# Patient Record
Sex: Male | Born: 1960
Health system: Southern US, Community
[De-identification: ages and names within clinical notes are randomized; demographics above are authoritative.]

## PROBLEM LIST (undated history)

## (undated) DIAGNOSIS — C439 Malignant melanoma of skin, unspecified: Secondary | ICD-10-CM

## (undated) DIAGNOSIS — E785 Hyperlipidemia, unspecified: Secondary | ICD-10-CM

## (undated) DIAGNOSIS — B009 Herpesviral infection, unspecified: Secondary | ICD-10-CM

## (undated) HISTORY — DX: Herpesviral infection, unspecified: B00.9

## (undated) HISTORY — PX: FOOT SURGERY: SHX648

## (undated) HISTORY — DX: Malignant melanoma of skin, unspecified: C43.9

## (undated) HISTORY — PX: SKIN SURGERY: SHX2413

## (undated) HISTORY — DX: Hyperlipidemia, unspecified: E78.5

---

## 2012-06-10 LAB — HM COLONOSCOPY

## 2013-12-03 LAB — PSA: PSA: 0.9

## 2013-12-03 LAB — LIPID PANEL
Cholesterol: 195 mg/dL (ref 0–200)
HDL: 38 mg/dL (ref 35–70)
LDL CALC: 132 mg/dL
Triglycerides: 125 mg/dL (ref 40–160)

## 2013-12-03 LAB — TSH: TSH: 2.1 u[IU]/mL (ref 0.41–5.90)

## 2015-01-24 LAB — HEPATIC FUNCTION PANEL
ALT: 26 U/L (ref 10–40)
AST: 21 U/L (ref 14–40)
BILIRUBIN, TOTAL: 0.5 mg/dL

## 2015-01-24 LAB — BASIC METABOLIC PANEL: Creatinine: 1 mg/dL (ref <=1.3)

## 2015-07-11 ENCOUNTER — Other Ambulatory Visit: Payer: Self-pay | Admitting: Orthopedic Surgery

## 2015-07-11 DIAGNOSIS — M79671 Pain in right foot: Secondary | ICD-10-CM

## 2015-08-03 ENCOUNTER — Ambulatory Visit
Admission: RE | Admit: 2015-08-03 | Discharge: 2015-08-03 | Disposition: A | Discharge status: Home / Self Care | Payer: BLUE CROSS/BLUE SHIELD | Source: Ambulatory Visit | Attending: Orthopedic Surgery | Admitting: Orthopedic Surgery

## 2015-08-03 DIAGNOSIS — M79671 Pain in right foot: Secondary | ICD-10-CM

## 2016-05-10 DIAGNOSIS — B009 Herpesviral infection, unspecified: Secondary | ICD-10-CM | POA: Diagnosis not present

## 2016-05-10 DIAGNOSIS — E7800 Pure hypercholesterolemia, unspecified: Secondary | ICD-10-CM | POA: Diagnosis not present

## 2016-05-23 DIAGNOSIS — Z0000 Encounter for general adult medical examination without abnormal findings: Secondary | ICD-10-CM | POA: Diagnosis not present

## 2016-05-24 LAB — TSH: TSH: 1.48 u[IU]/mL (ref 0.41–5.90)

## 2016-05-24 LAB — BASIC METABOLIC PANEL
BUN: 14 mg/dL (ref 4–21)
Creatinine: 1 mg/dL (ref 0.6–1.3)
GLUCOSE: 98 mg/dL
POTASSIUM: 4.6 mmol/L (ref 3.4–5.3)
SODIUM: 143 mmol/L (ref 137–147)

## 2016-05-24 LAB — LIPID PANEL
CHOLESTEROL: 152 mg/dL (ref 0–200)
HDL: 46 mg/dL (ref 35–70)
LDL Cholesterol: 90 mg/dL
LDl/HDL Ratio: 2
Triglycerides: 82 mg/dL (ref 40–160)

## 2016-05-24 LAB — HEPATIC FUNCTION PANEL
ALT: 25 U/L (ref 10–40)
AST: 22 U/L (ref 14–40)
Alkaline Phosphatase: 58 U/L (ref 25–125)
Bilirubin, Total: 0.5 mg/dL

## 2016-05-24 LAB — CBC AND DIFFERENTIAL
HEMATOCRIT: 47 % (ref 41–53)
Hemoglobin: 15.9 g/dL (ref 13.5–17.5)
Neutrophils Absolute: 3 /uL
Platelets: 202 10*3/uL (ref 150–399)
WBC: 5.4 10^3/mL

## 2016-05-27 ENCOUNTER — Ambulatory Visit (INDEPENDENT_AMBULATORY_CARE_PROVIDER_SITE_OTHER): Payer: BLUE CROSS/BLUE SHIELD | Admitting: Family Medicine

## 2016-05-27 ENCOUNTER — Encounter: Payer: Self-pay | Admitting: Family Medicine

## 2016-05-27 DIAGNOSIS — E785 Hyperlipidemia, unspecified: Secondary | ICD-10-CM | POA: Diagnosis not present

## 2016-05-27 DIAGNOSIS — L570 Actinic keratosis: Secondary | ICD-10-CM | POA: Insufficient documentation

## 2016-05-27 DIAGNOSIS — B009 Herpesviral infection, unspecified: Secondary | ICD-10-CM | POA: Diagnosis not present

## 2016-05-27 MED ORDER — ACYCLOVIR 400 MG PO TABS: 400.0000 mg | ORAL_TABLET | Freq: Every day | ORAL | 3 refills | Status: DC

## 2016-05-27 MED ORDER — ATORVASTATIN CALCIUM 40 MG PO TABS: 40.0000 mg | ORAL_TABLET | Freq: Every day | ORAL | 3 refills | Status: DC

## 2016-05-27 NOTE — Assessment & Plan Note (Signed)
S:acyclovir 400mg  BID for suppression in past--> now daily. no flare in years.  A/P: doing well- refilled medication.

## 2016-05-27 NOTE — Progress Notes (Signed)
Phone: [phone]  Subjective:  Patient presents today to establish care.  Prior patient of Dr Tollie Pizza of [facility] [place]. . Chief complaint-noted.   See problem oriented charting  The following were reviewed and entered/updated in epic: Past Medical History:  Diagnosis Date  . HSV infection    acyclovir 400mg  BID for suppression. no flare in years.   . Hyperlipidemia    atorvastatin 40mg .    Patient Active Problem List   Diagnosis Date Noted  . Hyperlipidemia     Priority: Medium  . HSV infection     Priority: Medium  . Actinic keratosis 05/27/2016    Priority: Low   Past Surgical History:  Procedure Laterality Date  . FOOT SURGERY Right    nov 16. Dr. Doran Durand initial. thinking about seeking [facility] opinion    Family History  Problem Relation Age of Onset  . Hyperlipidemia Mother     8 in 2018  . Heart disease Mother     pacemaker  . Healthy Father     52 in 2018  . Healthy Sister   . Melanoma Brother   . Healthy Daughter   . Healthy Son     Medications- reviewed and updated Current Outpatient Prescriptions  Medication Sig Dispense Refill  . atorvastatin (LIPITOR) 40 MG tablet Take 1 tablet (40 mg total) by mouth daily. 91 tablet 3  . acyclovir (ZOVIRAX) 400 MG tablet Take 1 tablet (400 mg total) by mouth daily. 91 tablet 3   No current facility-administered medications for this visit.     Allergies-reviewed and updated No Known Allergies  Social History   Social History  . Marital status: Married    Spouse name: N/A  . Number of children: N/A  . Years of education: N/A   Social History Main Topics  . Smoking status: Never Smoker  . Smokeless tobacco: Never Used  . Alcohol use 2.4 - 3.0 oz/week    4 - 5 Standard drinks or equivalent per week     Comment: Social  . Drug use: No  . Sexual activity: Yes   Other Topics Concern  . None   Social History Narrative   Married. See wife Lelon Frohlich and will see son Yong Channel 20- app state. Daughter  18- senior at [employer]. [employer], [place], [employer], [employer].       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    [employer] prior.       Hobbies: beach, fishing    ROS--Full ROS was completed Review of Systems  Constitutional: Negative for chills and fever.  HENT: Negative for hearing loss.   Eyes: Negative for blurred vision and double vision.  Respiratory: Negative for cough and shortness of breath.   Cardiovascular: Negative for chest pain and palpitations.  Gastrointestinal: Negative for heartburn and nausea.  Genitourinary: Negative for dysuria and urgency.  Musculoskeletal: Negative for myalgias and neck pain.  Skin: Negative for itching and rash.  Neurological: Negative for dizziness and headaches.  Endo/Heme/Allergies: Negative for polydipsia. Does not bruise/bleed easily.  Psychiatric/Behavioral: Negative for hallucinations and substance abuse.   Objective: BP 116/78 (BP Location: Left Arm, Patient Position: Sitting, Cuff Size: Large)   Pulse 76   Temp 99 F (37.2 C) (Oral)   Ht 6' 1.5" (1.867 m)   Wt 242 lb 3.2 oz (109.9 kg)   SpO2 94%   BMI 31.52 kg/m  Gen: NAD, resting comfortably HEENT: Mucous membranes are moist. Oropharynx normal. TM normal. Eyes: sclera and lids normal, PERRLA Neck: no thyromegaly,  no cervical lymphadenopathy CV: RRR no murmurs rubs or gallops Lungs: CTAB no crackles, wheeze, rhonchi Abdomen: soft/nontender/nondistended/normal bowel sounds. No rebound or guarding. overweight Ext: no edema Skin: warm, dry Neuro: 5/5 strength in upper and lower extremities, normal gait, normal reflexes  Assessment/Plan:  Health Maintenance Due  Topic Date Due  . Hepatitis C Screening - discuss at [facility], declines for now 55-30-1962  . HIV Screening  - discuss at [facility], declines for now 08/31/53    . COLONOSCOPY - Dr. Collene Mares at age 65. Told 10 year follow up. Told to sign ROI. Will check on next visit.  09/01/2011   Discussed healthy eating, regular exercise, target more  215-225 range.   Hyperlipidemia S:atorvastatin 40mg  for 5 years. Lost 65 lbs in 2017. 275 max to 212 then gained over 20 lbs. Wants to target 225. College 186.  A/P:bloodwork at work within a week- will send me a copy. cbg had bene noted at 104 in past- check on this with labs as well.   HSV infection S:acyclovir 400mg  BID for suppression in past--> now daily. no flare in years.  A/P: doing well- refilled medication.     Return in about 1 year (around 05/27/2017) for physical unless you need [place] sooner or bloodwork concerns.  Meds ordered this encounter  Medications  . DISCONTD: atorvastatin (LIPITOR) 40 MG tablet    Sig: Take 40 mg by mouth daily.  Marland Kitchen acyclovir (ZOVIRAX) 400 MG tablet    Sig: Take 1 tablet (400 mg total) by mouth daily.    Dispense:  91 tablet    Refill:  3  . atorvastatin (LIPITOR) 40 MG tablet    Sig: Take 1 tablet (40 mg total) by mouth daily.    Dispense:  91 tablet    Refill:  3    Return precautions advised.  Garret Reddish, MD

## 2016-05-27 NOTE — Assessment & Plan Note (Addendum)
S:atorvastatin 40mg  for 5 years. Lost 65 lbs in 2017. 275 max to 212 then gained over 20 lbs. Wants to target 225. College 186.  A/P:bloodwork at work within a week- will send me a copy. cbg had bene noted at 104 in past- check on this with labs as well.

## 2016-05-27 NOTE — Patient Instructions (Addendum)
Sign release of information at the check out desk for Dr. Collene Mares last colonoscopy  ______________________________________________________________________  Starting October 1st 2018, I will be transferring to our new location: [facility] [address] (corner of [address] [employer]) [place], [place] Phone: [phone]  I would love to have you remain my patient at this new location as long as it remains convenient for you. I am excited about the opportunity to have x-ray and sports medicine in the new building but will really miss the awesome staff and physicians at [facility]. Continue to schedule appointments at [facility] and we will automatically transfer them to the [place] location starting October 1st.

## 2016-05-27 NOTE — Progress Notes (Signed)
Pre visit review using our clinic review tool, if applicable. No additional management support is needed unless otherwise documented below in the visit note. 

## 2016-06-05 ENCOUNTER — Encounter: Payer: Self-pay | Admitting: Family Medicine

## 2016-06-07 ENCOUNTER — Encounter: Payer: Self-pay | Admitting: Family Medicine

## 2016-06-07 DIAGNOSIS — R739 Hyperglycemia, unspecified: Secondary | ICD-10-CM | POA: Insufficient documentation

## 2016-08-28 DIAGNOSIS — D2271 Melanocytic nevi of right lower limb, including hip: Secondary | ICD-10-CM | POA: Diagnosis not present

## 2016-08-28 DIAGNOSIS — D225 Melanocytic nevi of trunk: Secondary | ICD-10-CM | POA: Diagnosis not present

## 2016-08-28 DIAGNOSIS — L858 Other specified epidermal thickening: Secondary | ICD-10-CM | POA: Diagnosis not present

## 2016-08-28 DIAGNOSIS — D2261 Melanocytic nevi of right upper limb, including shoulder: Secondary | ICD-10-CM | POA: Diagnosis not present

## 2016-11-21 ENCOUNTER — Encounter: Payer: Self-pay | Admitting: Family Medicine

## 2016-11-21 ENCOUNTER — Ambulatory Visit (INDEPENDENT_AMBULATORY_CARE_PROVIDER_SITE_OTHER): Payer: BLUE CROSS/BLUE SHIELD | Admitting: Family Medicine

## 2016-11-21 VITALS — BP 120/86 | HR 84 | Temp 98.3°F | Ht 73.75 in | Wt 242.8 lb

## 2016-11-21 DIAGNOSIS — Z0001 Encounter for general adult medical examination with abnormal findings: Secondary | ICD-10-CM | POA: Diagnosis not present

## 2016-11-21 DIAGNOSIS — Z1159 Encounter for screening for other viral diseases: Secondary | ICD-10-CM

## 2016-11-21 DIAGNOSIS — Z125 Encounter for screening for malignant neoplasm of prostate: Secondary | ICD-10-CM

## 2016-11-21 DIAGNOSIS — Z7251 High risk heterosexual behavior: Secondary | ICD-10-CM

## 2016-11-21 DIAGNOSIS — Z114 Encounter for screening for human immunodeficiency virus [HIV]: Secondary | ICD-10-CM

## 2016-11-21 DIAGNOSIS — E785 Hyperlipidemia, unspecified: Secondary | ICD-10-CM

## 2016-11-21 LAB — COMPREHENSIVE METABOLIC PANEL
ALBUMIN: 4.6 g/dL (ref 3.5–5.2)
ALK PHOS: 58 U/L (ref 39–117)
ALT: 26 U/L (ref 0–53)
AST: 23 U/L (ref 0–37)
BILIRUBIN TOTAL: 0.9 mg/dL (ref 0.2–1.2)
BUN: 13 mg/dL (ref 6–23)
CALCIUM: 9.7 mg/dL (ref 8.4–10.5)
CHLORIDE: 101 meq/L (ref 96–112)
CO2: 31 mEq/L (ref 19–32)
CREATININE: 0.93 mg/dL (ref 0.40–1.50)
GFR: 89.58 mL/min (ref >60.00)
Glucose, Bld: 99 mg/dL (ref 70–99)
Potassium: 4.5 mEq/L (ref 3.5–5.1)
SODIUM: 140 meq/L (ref 135–145)
TOTAL PROTEIN: 7.4 g/dL (ref 6.0–8.3)

## 2016-11-21 LAB — PSA: PSA: 0.58 ng/mL (ref 0.10–4.00)

## 2016-11-21 LAB — LIPID PANEL
CHOLESTEROL: 164 mg/dL (ref 0–200)
HDL: 64.1 mg/dL (ref >39.00)
LDL CALC: 81 mg/dL (ref 0–99)
NonHDL: 100.02
TRIGLYCERIDES: 93 mg/dL (ref 0.0–149.0)
Total CHOL/HDL Ratio: 3
VLDL: 18.6 mg/dL (ref 0.0–40.0)

## 2016-11-21 LAB — CBC
HCT: 48.7 % (ref 39.0–52.0)
Hemoglobin: 16.1 g/dL (ref 13.0–17.0)
MCHC: 32.9 g/dL (ref 30.0–36.0)
MCV: 88.8 fl (ref 78.0–100.0)
PLATELETS: 218 10*3/uL (ref 150.0–400.0)
RBC: 5.49 Mil/uL (ref 4.22–5.81)
RDW: 13.7 % (ref 11.5–15.5)
WBC: 5.7 10*3/uL (ref 4.0–10.5)

## 2016-11-21 MED ORDER — AMOXICILLIN-POT CLAVULANATE 875-125 MG PO TABS: 1.0000 | ORAL_TABLET | Freq: Two times a day (BID) | ORAL | 0 refills | Status: AC

## 2016-11-21 NOTE — Progress Notes (Signed)
Phone: [phone]  Subjective:  Patient presents today for their annual physical. Chief complaint-noted.   See problem oriented charting- ROS- full  review of systems was completed and negative except for: sinus symptoms as noted below  The following were reviewed and entered/updated in epic: Past Medical History:  Diagnosis Date  . HSV infection    acyclovir 400mg  BID for suppression. no flare in years.   . Hyperlipidemia    atorvastatin 40mg .    Patient Active Problem List   Diagnosis Date Noted  . Hyperlipidemia     Priority: Medium  . HSV infection     Priority: Medium  . Hyperglycemia 06/07/2016    Priority: Low  . Actinic keratosis 05/27/2016    Priority: Low   Past Surgical History:  Procedure Laterality Date  . FOOT SURGERY Right    nov 16. Dr. Doran Durand initial. thinking about seeking [facility] opinion    Family History  Problem Relation Age of Onset  . Hyperlipidemia Mother        85 in 2018  . Heart disease Mother        pacemaker  . Healthy Father        23 in 2018  . Healthy Sister   . Melanoma Brother   . Healthy Daughter   . Healthy Son     Medications- reviewed and updated Current Outpatient Prescriptions  Medication Sig Dispense Refill  . acyclovir (ZOVIRAX) 400 MG tablet Take 1 tablet (400 mg total) by mouth daily. 91 tablet 3  . atorvastatin (LIPITOR) 40 MG tablet Take 1 tablet (40 mg total) by mouth daily. 91 tablet 3  . amoxicillin-clavulanate (AUGMENTIN) 875-125 MG tablet Take 1 tablet by mouth 2 (two) times daily. 14 tablet 0   No current facility-administered medications for this visit.     Allergies-reviewed and updated No Known Allergies  Social History   Social History  . Marital status: Married    Spouse name: N/A  . Number of children: N/A  . Years of education: N/A   Social History Main Topics  . Smoking status: Never Smoker  . Smokeless tobacco: Never Used  . Alcohol use 2.4 - 3.0 oz/week    4 - 5 Standard drinks or  equivalent per week     Comment: Social  . Drug use: No  . Sexual activity: Yes   Other Topics Concern  . None   Social History Narrative   Married. See wife Lelon Frohlich and will see son Yong Channel 20- app state. Daughter 36- senior at [employer]. [employer], [place], [employer], [employer].       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    [employer] prior.       Hobbies: beach, fishing    Objective: BP 120/86 (BP Location: Left Arm, Patient Position: Sitting, Cuff Size: Large)   Pulse 84   Temp 98.3 F (36.8 C) (Oral)   Ht 6' 1.75" (1.873 m)   Wt 242 lb 12.8 oz (110.1 kg)   SpO2 95%   BMI 31.39 kg/m  Gen: NAD, resting comfortably HEENT: Mucous membranes are moist. Pharynx mild erythema. Nares erythematous with green discharge. Maxillary sinus tenderness.  Neck: no thyromegaly CV: RRR no murmurs rubs or gallops Lungs: CTAB no crackles, wheeze, rhonchi Abdomen: soft/nontender/nondistended/normal bowel sounds. No rebound or guarding.  Ext: no edema Skin: warm, dry Neuro: grossly normal, moves all extremities, PERRLA Rectal: normal tone, normal sized prostate, no masses or tenderness  Assessment/Plan:  56 y.o. male presenting for annual physical.  Health  Maintenance counseling: 1. Anticipatory guidance: Patient counseled regarding regular dental exams -q6 months, eye exams - yearly or every 2 years, wearing seatbelts.  2. Risk factor reduction:  Advised patient of need for regular exercise and diet rich and fruits and vegetables to reduce risk of heart attack and stroke. Exercise- 1-2 days a week. discussed 150 minutes a week. Diet-feels he needs to cut carbs down and lean out his diet. Knows he needs to lose weight. Set goal within a year of at least 10 lbs.  Wt Readings from Last 3 Encounters:  11/21/16 242 lb 12.8 oz (110.1 kg)  05/27/16 242 lb 3.2 oz (109.9 kg)  3. Immunizations/screenings/ancillary studies- offered flu shot - declines (typically gets sick). Discussed shingrix availability issues.  Screen HIV and HCV with labs Immunization History  Administered Date(s) Administered  . Tdap 08/09/2008   4. Prostate cancer screening-  will update PSA today to trend. Low risk rectal .  Lab Results  Component Value Date   PSA 0.90 12/03/2013   5. Colon cancer screening -  do not have records from Dr. Collene Mares at age 56- should be good until 9 6. Skin cancer screening- sees Dr. Martinique most years with [place] [facility]. advised regular sunscreen use. Denies worrisome, changing, or new skin lesions.   Status of chronic or acute concerns   Took zyrtec D this morning. Sore throat, nasal drainage- green. Minimal sinus pressure. Coughing up green. No ear pain. No SOb. 10 days of symptoms Treat for bacterial sinusitis given sinus pressure on exam, 10 days of symptoms with purulent discharge. augmentin 10 days  Breaking out daily in hives on face- taking benadryl or claritin when it comes on. Started on neck now onto face. No new shaving cream or shampoo, soap, dry cleaner. No lip or tongue swelling. Going on for 1.5 years. Short lived.  Trial more regular antihistamine use. Could add h2 blocker. Benadryl for back up. Declines allergist referral.   HLD- compliant atorvastatin 40mg . In march full lipids show LDL 90.   HSV infection- on daily suppresive therapy with acyclovir 400mg  daily only  Hyperglycemia- 2018 down to 98 with weigh tloss  1 year CPE  Orders Placed This Encounter  Procedures  . CBC    Standing Status:   Future    Standing Expiration Date:   11/21/2017  . Comprehensive metabolic panel    [place]    Standing Status:   Future    Standing Expiration Date:   11/21/2017  . HIV antibody    Standing Status:   Future    Standing Expiration Date:   11/21/2017  . Hepatitis C antibody    Standing Status:   Future    Standing Expiration Date:   11/21/2017  . Lipid panel    Standing Status:   Future    Standing Expiration Date:   11/21/2017  . PSA    Standing Status:   Future    Standing  Expiration Date:   11/21/2017    Meds ordered this encounter  Medications  . amoxicillin-clavulanate (AUGMENTIN) 875-125 MG tablet    Sig: Take 1 tablet by mouth 2 (two) times daily.    Dispense:  14 tablet    Refill:  0    Return precautions advised.  Garret Reddish, MD

## 2016-11-21 NOTE — Patient Instructions (Addendum)
Sign release of information at the check out desk for colonoscopy from Dr. Collene Mares  Bacterial sinus infection based on 10 days of symptoms, sinus pressure, green/yellow discharge. Treat with augmentin for 7 days twice a day- take with food  Let me know if you change your mind on the allergist appointment. Would take claritin or zyrtec daily to at least minimize the hives.   Sugar was ok at your office check- hopeful remains ok- regardless lets set a goal of 10 lbs off at minimum in next year.   Please stop by lab before you go

## 2016-11-21 NOTE — Addendum Note (Signed)
Addended by: Tomi Likens on: 11/21/2016 10:52 AM   Modules accepted: Orders

## 2016-11-22 LAB — HEPATITIS C ANTIBODY
HEP C AB: NONREACTIVE
SIGNAL TO CUT-OFF: 0.01 (ref <1.00)

## 2016-11-22 LAB — HIV ANTIBODY (ROUTINE TESTING W REFLEX): HIV: NONREACTIVE

## 2017-05-16 ENCOUNTER — Other Ambulatory Visit: Payer: Self-pay | Admitting: Family Medicine

## 2017-06-18 IMAGING — CT CT FOOT*R* W/O CM
3 of 4 series · 12 of 33 positions shown, 14 images · non-contrast
Comparison: None.

CLINICAL DATA: Right foot pain, injured several years ago. Right
foot surgery January 2015.

EXAM:
CT OF THE RIGHT FOOT WITHOUT CONTRAST
TECHNIQUE: Multidetector CT imaging of the right foot was performed according
to the standard protocol. Multiplanar CT image reconstructions were
also generated.

[Series 10: sagsoft tissue · sagittal · 0.32mm/px · 5 of 53 slices shown, 6 images]
[im 18/53  bone]
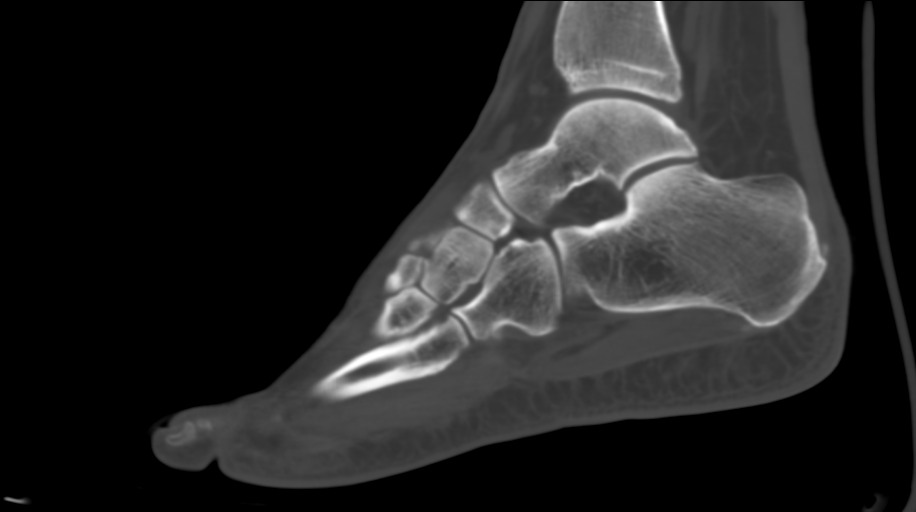
[im 22/53  bone]
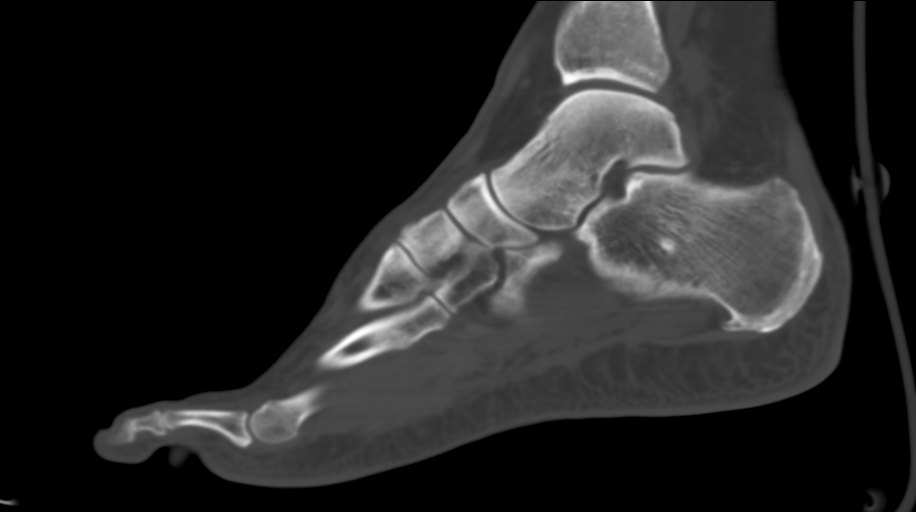
[im 27/53  soft-tissue]
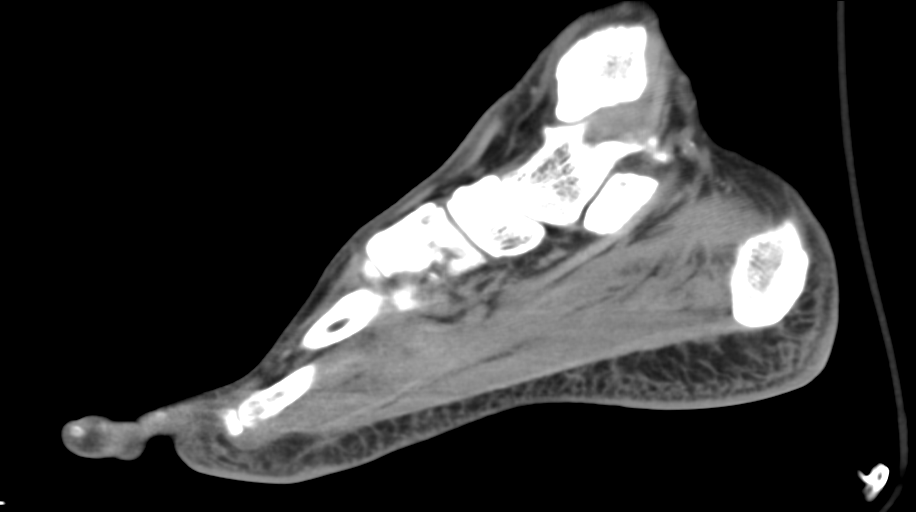
[im 27/53  bone]
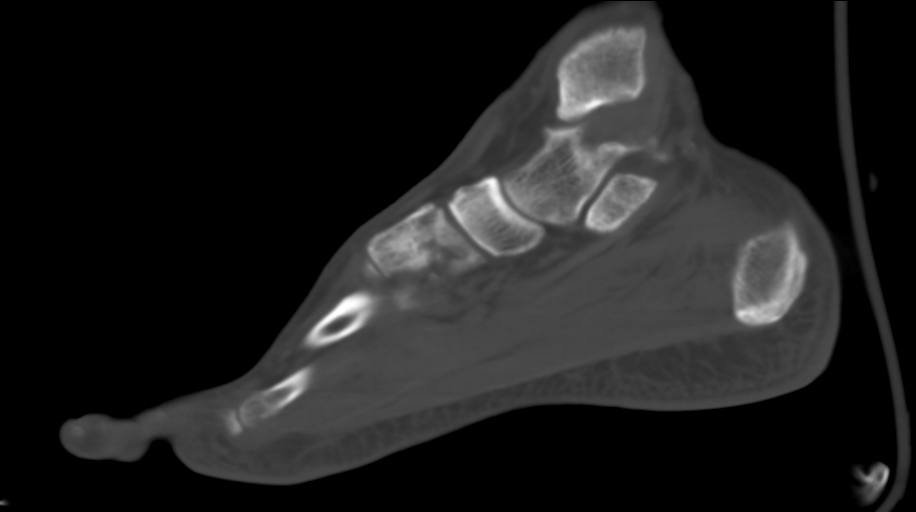
[im 31/53  bone]
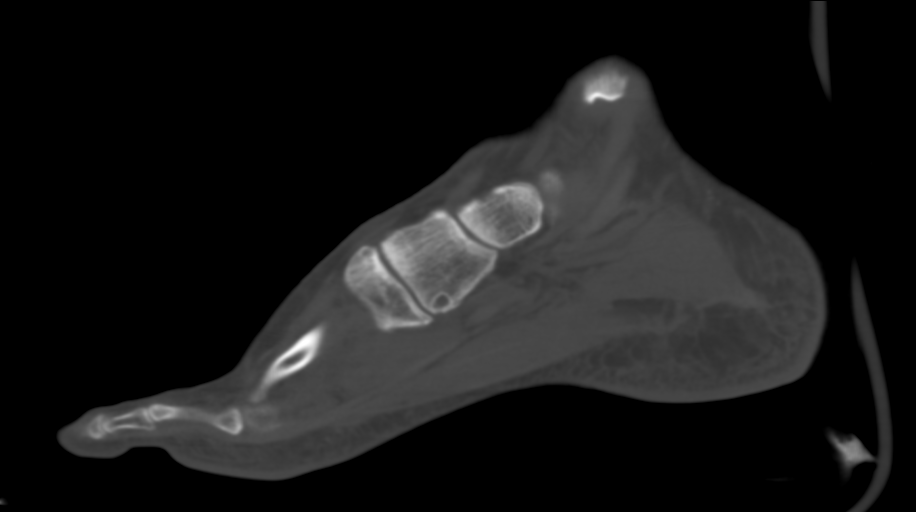
[im 35/53  bone]
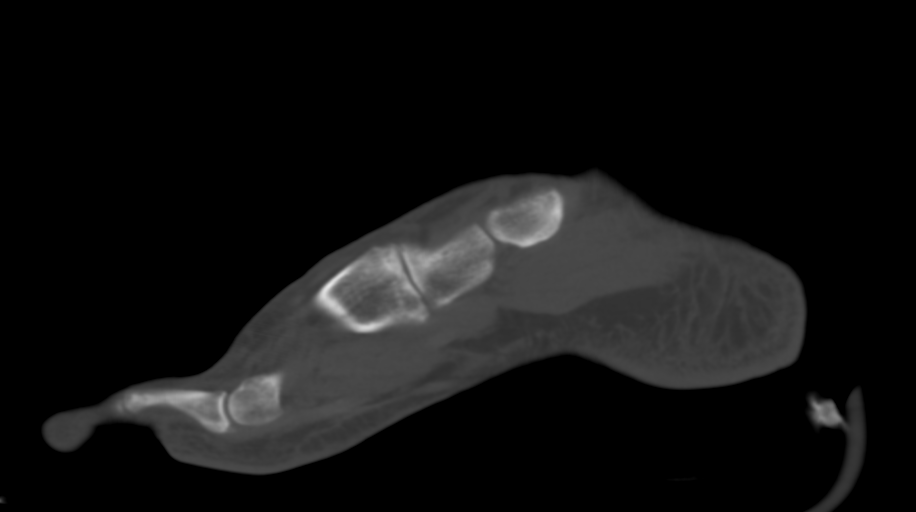

[Series 607: coronal soft · axial · 0.61mm/px · z∈[-6,+87]mm · 4 of 74 slices shown, 5 images]
[im 13/74  soft-tissue]
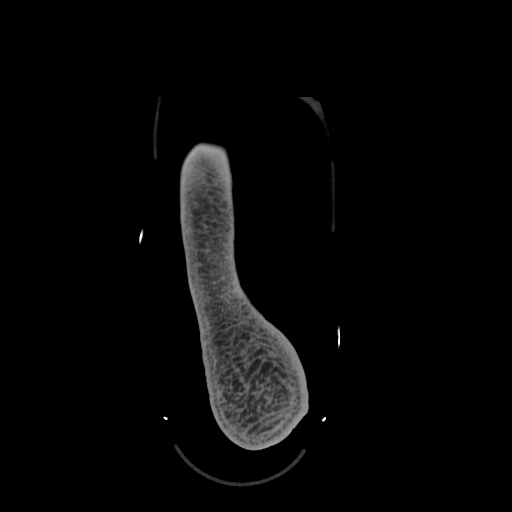
[im 13/74  bone]
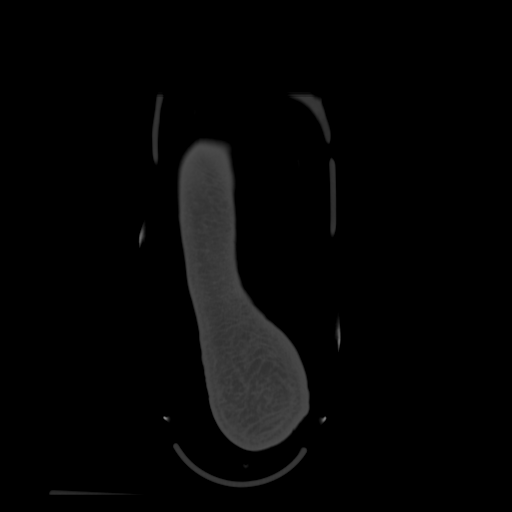
[im 25/74  bone]
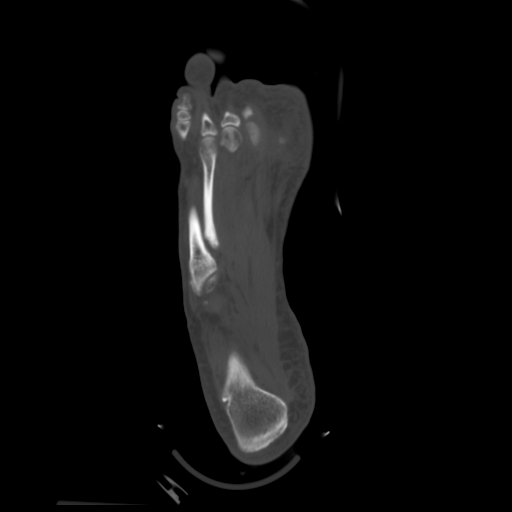
[im 49/74  bone]
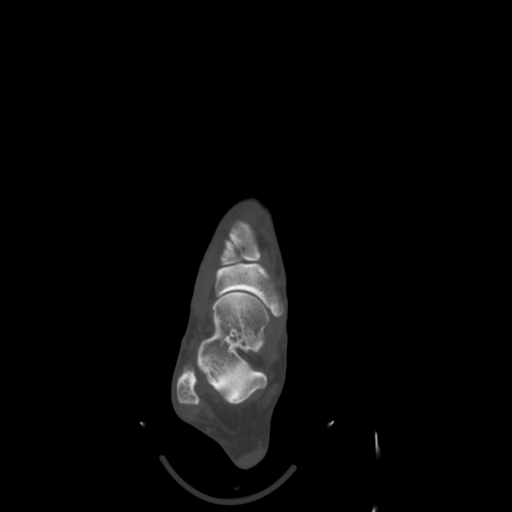
[im 61/74  bone]
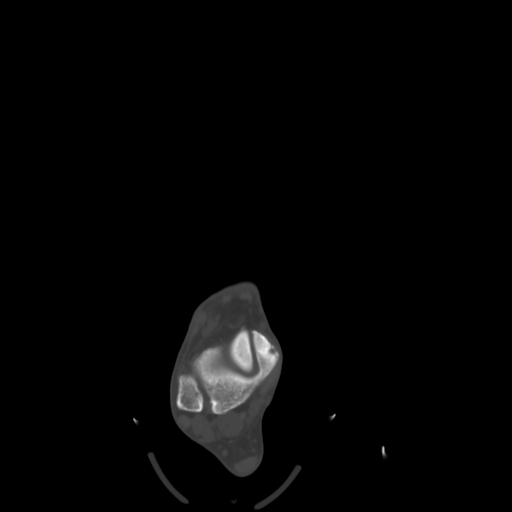

[Series 608: axial soft · coronal · 0.61mm/px · 3 of 138 slices shown]
[im 51/138  bone]
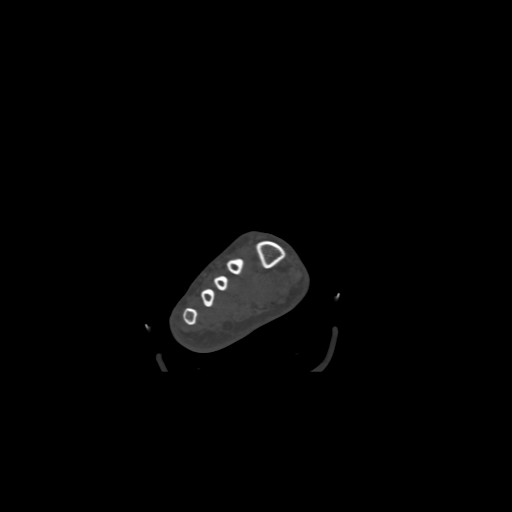
[im 63/138  bone]
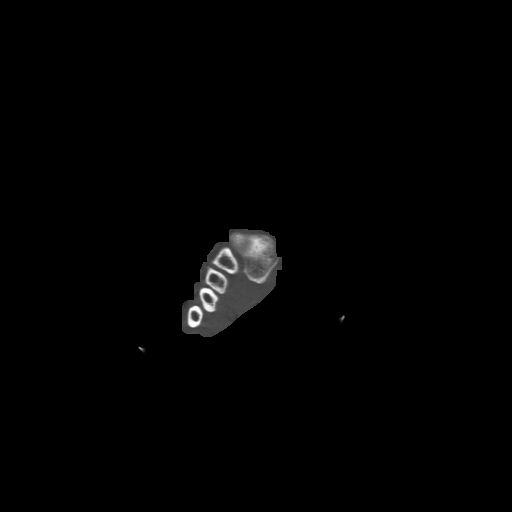
[im 75/138  bone]
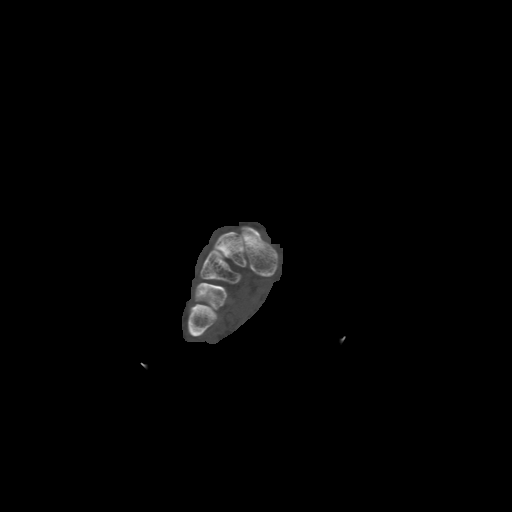

[12 of 33 positions shown; findings below may reference images not displayed]

FINDINGS: Bones/Joint/Cartilage

Prior arthrodesis of the first MTP joint transfix with a dorsal
sideplate and a fully threaded screw without osseous bridging across
the first MTP joint. No hardware failure complication.

No acute fracture or dislocation. Normal subtalar joints. Normal
sinus tarsi. Normal ankle mortise. Fragmented os peroneus. Normal
alignment. No joint effusion.

Tendons
Intact flexor, extensor and Achilles tendons. Intact peroneal
tendons with findings concerning for mild tenosynovitis.

Muscles

Normal.

Soft tissue
No fluid collection or hematoma.  No soft tissue mass.
IMPRESSION: 1. Prior arthrodesis of the first MTP joint transfixed with a dorsal
sideplate and a fully threaded screw without osseous bridging across
the first MTP joint.
2. Intact peroneal tendons with findings concerning for mild
tenosynovitis.

## 2017-08-07 ENCOUNTER — Other Ambulatory Visit: Payer: Self-pay

## 2017-08-07 MED ORDER — ATORVASTATIN CALCIUM 40 MG PO TABS: 40.0000 mg | ORAL_TABLET | Freq: Every day | ORAL | 3 refills | Status: DC

## 2017-11-06 ENCOUNTER — Telehealth: Payer: Self-pay

## 2017-11-06 MED ORDER — ACYCLOVIR 400 MG PO TABS: 400.0000 mg | ORAL_TABLET | Freq: Two times a day (BID) | ORAL | 1 refills | Status: DC

## 2017-11-06 NOTE — Telephone Encounter (Signed)
Pt called back about [facility] [place], [place] he states that he has moved there in [place] but will still keep his provider and that he would like a 60 day refill or 6 months refill on the Acyclovir

## 2017-11-06 NOTE — Telephone Encounter (Signed)
Called and left a voicemail message requesting a return phone call. Re[facility] a request from a [facility] in [place], [place] that patient is requesting a refill on acyclovir (ZOVIRAX) 400 MG tablet. Is patient traveling temporarily? How many pills does he need?

## 2017-11-06 NOTE — Telephone Encounter (Signed)
Pt notified Rx sent to pharmacy as requested. 

## 2017-11-06 NOTE — Telephone Encounter (Signed)
See note

## 2017-11-06 NOTE — Addendum Note (Signed)
Addended by: Marian Sorrow on: 11/06/2017 01:25 PM   Modules accepted: Orders

## 2017-11-06 NOTE — Telephone Encounter (Signed)
Yes thanks, may fill- looks like has visit next month

## 2017-12-05 ENCOUNTER — Encounter: Payer: BLUE CROSS/BLUE SHIELD | Admitting: Family Medicine

## 2017-12-07 DIAGNOSIS — J0140 Acute pansinusitis, unspecified: Secondary | ICD-10-CM | POA: Diagnosis not present

## 2018-02-20 DIAGNOSIS — L853 Xerosis cutis: Secondary | ICD-10-CM | POA: Diagnosis not present

## 2018-02-20 DIAGNOSIS — L570 Actinic keratosis: Secondary | ICD-10-CM | POA: Diagnosis not present

## 2018-02-20 DIAGNOSIS — D485 Neoplasm of uncertain behavior of skin: Secondary | ICD-10-CM | POA: Diagnosis not present

## 2018-03-13 DIAGNOSIS — C4372 Malignant melanoma of left lower limb, including hip: Secondary | ICD-10-CM | POA: Diagnosis not present

## 2018-04-03 DIAGNOSIS — D1801 Hemangioma of skin and subcutaneous tissue: Secondary | ICD-10-CM | POA: Diagnosis not present

## 2018-04-03 DIAGNOSIS — L570 Actinic keratosis: Secondary | ICD-10-CM | POA: Diagnosis not present

## 2018-04-03 DIAGNOSIS — L821 Other seborrheic keratosis: Secondary | ICD-10-CM | POA: Diagnosis not present

## 2018-04-03 DIAGNOSIS — Z85820 Personal history of malignant melanoma of skin: Secondary | ICD-10-CM | POA: Diagnosis not present

## 2018-04-03 DIAGNOSIS — D485 Neoplasm of uncertain behavior of skin: Secondary | ICD-10-CM | POA: Diagnosis not present

## 2018-04-03 DIAGNOSIS — Z1283 Encounter for screening for malignant neoplasm of skin: Secondary | ICD-10-CM | POA: Diagnosis not present

## 2018-04-07 DIAGNOSIS — C4372 Malignant melanoma of left lower limb, including hip: Secondary | ICD-10-CM | POA: Diagnosis not present

## 2018-05-08 DIAGNOSIS — L97812 Non-pressure chronic ulcer of other part of right lower leg with fat layer exposed: Secondary | ICD-10-CM | POA: Diagnosis not present

## 2018-05-08 DIAGNOSIS — C4372 Malignant melanoma of left lower limb, including hip: Secondary | ICD-10-CM | POA: Diagnosis not present

## 2018-07-28 ENCOUNTER — Other Ambulatory Visit: Payer: Self-pay | Admitting: Family Medicine

## 2018-07-28 ENCOUNTER — Ambulatory Visit: Payer: BLUE CROSS/BLUE SHIELD | Admitting: Family Medicine

## 2018-07-28 ENCOUNTER — Encounter: Payer: Self-pay | Admitting: Family Medicine

## 2018-07-28 NOTE — Patient Instructions (Addendum)
Health Maintenance Due  Topic Date Due  . COLONOSCOPY Please call Dr. Collene Mares to have these results sent to our office 09/01/2011    Depression screen [facility] 2/9 07/28/2018 11/21/2016  Decreased Interest 0 0  Down, Depressed, Hopeless 0 0  PHQ - 2 Score 0 0    Patient stated that he cannot do the virtual visit after 3pm. Pt was kind of rushing me off the phone to speak to Dr. Yong Channel ASAP.

## 2018-07-28 NOTE — Telephone Encounter (Signed)
Left detailed message on personal voicemail, calling about refill request unable to refill need to call office to schedule virtual visit.

## 2018-07-28 NOTE — Progress Notes (Signed)
I was 25 mins late to visit. Patient had to reschedule as a result.   LOS no charge  Garret Reddish

## 2018-07-29 ENCOUNTER — Ambulatory Visit (INDEPENDENT_AMBULATORY_CARE_PROVIDER_SITE_OTHER): Payer: BLUE CROSS/BLUE SHIELD | Admitting: Family Medicine

## 2018-07-29 ENCOUNTER — Encounter: Payer: Self-pay | Admitting: Family Medicine

## 2018-07-29 VITALS — Ht 73.75 in | Wt 250.0 lb

## 2018-07-29 DIAGNOSIS — E669 Obesity, unspecified: Secondary | ICD-10-CM | POA: Diagnosis not present

## 2018-07-29 DIAGNOSIS — Z85820 Personal history of malignant melanoma of skin: Secondary | ICD-10-CM | POA: Diagnosis not present

## 2018-07-29 DIAGNOSIS — B009 Herpesviral infection, unspecified: Secondary | ICD-10-CM | POA: Diagnosis not present

## 2018-07-29 DIAGNOSIS — E785 Hyperlipidemia, unspecified: Secondary | ICD-10-CM | POA: Diagnosis not present

## 2018-07-29 MED ORDER — ACYCLOVIR 400 MG PO TABS: 400.0000 mg | ORAL_TABLET | Freq: Every day | ORAL | 3 refills | Status: DC

## 2018-07-29 MED ORDER — ATORVASTATIN CALCIUM 40 MG PO TABS: 40.0000 mg | ORAL_TABLET | Freq: Every day | ORAL | 3 refills | Status: DC

## 2018-07-29 NOTE — Assessment & Plan Note (Signed)
S:  controlled on last check here on atorvastatin 40 mg.  Mild poor control on work labs-see below 25th of February Total cholesterol- 185, LDL 107, triglycerides 104, HDL 57  Alt 26, ast 26, bilirubin 0.6.  Creatinine 0.95 Bun/cr ratio- 13  Hemoglobin 16.4 Platelets 273 Wbc 7.3  A/P: Mild poorly controlled-recommended working on healthy eating, regular exercise and can recheck at physical in 6 months.  Refilled atorvastatin for a year-we will give chance for lifestyle changes before increasing statin dose.

## 2018-07-29 NOTE — Progress Notes (Signed)
Phone [phone]   Subjective:  Virtual visit via Video note. Chief complaint: Chief Complaint  Patient presents with  . Follow-up   This visit type was conducted due to national recommendations for restrictions regarding the COVID-19 Pandemic (e.g. social distancing).  This format is felt to be most appropriate for this patient at this time balancing risks to patient and risks to population by having him in for in person visit.  No physical exam was performed (except for noted visual exam or audio findings with Telehealth visits).    Our team/I connected with Edward Gonzalez at 11:20 AM EDT by a video enabled telemedicine application (doxy.me or caregility through epic) and verified that I am speaking with the correct person using two identifiers.  Location patient: [facility] Location provider: [facility], office Persons participating in the virtual visit:  patient  Our team/I discussed the limitations of evaluation and management by telemedicine and the availability of in person appointments. In light of current covid-19 pandemic, patient also understands that we are trying to protect them by minimizing in office contact if at all possible.  The patient expressed consent for telemedicine visit and agreed to proceed. Patient understands insurance will be billed.   ROS- No chest pain or shortness of breath. No headache or blurry vision.   Past Medical History-  Patient Active Problem List   Diagnosis Date Noted  . History of melanoma 07/29/2018    Priority: Medium  . Hyperlipidemia     Priority: Medium  . HSV infection     Priority: Medium  . Hyperglycemia 06/07/2016    Priority: Low  . Actinic keratosis 05/27/2016    Priority: Low    Medications- reviewed and updated Current Outpatient Medications  Medication Sig Dispense Refill  . acyclovir (ZOVIRAX) 400 MG tablet Take 1 tablet (400 mg total) by mouth 2 (two) times daily. 180 tablet 1  . atorvastatin (LIPITOR) 40 MG  tablet Take 1 tablet (40 mg total) by mouth daily. 90 tablet 3   No current facility-administered medications for this visit.      Objective:  Ht 6' 1.75" (1.873 m)   Wt 250 lb (113.4 kg)   BMI 32.32 kg/m  self reported vitals Gen: NAD, resting comfortably Lungs: nonlabored, normal respiratory rate  Skin: appears dry, no obvious rash- scab over shin noted where prior melanoma was removed    Assessment and Plan   # Obesity S:patient admits to weight gain of 8 pounds since I saw him in late 2018- physical was missed in 2019 because I was out of the office for several weeks.  He states his exercises been cut back related to Melanoma on his leg (dime sized blood blister is what he initially thought it was- like he had hit his leg) - found January 28th Stage I. Almost healed now but Would break open if exercising- and eating multiple meals a day- eating with kids and at off times. Thinks its closed enough hat he can get back to gexercise.   Diet largely stable but feels activity has fallen off Wt Readings from Last 3 Encounters:  07/29/18 250 lb (113.4 kg)  07/28/18 247 lb (112 kg)  11/21/16 242 lb 12.8 oz (110.1 kg)  A/P: Encouraged approximately 67-month physical to check in on weight amongst other things- Encouraged need for healthy eating, regular exercise, weight loss.     History of melanoma Sees dermatology every 3 months after having melanoma discovered early 2020-fully removed-reports stage I  Hyperlipidemia S:  controlled on last check here on atorvastatin 40 mg.  Mild poor control on work labs-see below 25th of February Total cholesterol- 185, LDL 107, triglycerides 104, HDL 57  Alt 26, ast 26, bilirubin 0.6.  Creatinine 0.95 Bun/cr ratio- 13  Hemoglobin 16.4 Platelets 273 Wbc 7.3  A/P: Mild poorly controlled-recommended working on healthy eating, regular exercise and can recheck at physical in 6 months.  Refilled atorvastatin for a year-we will give chance for  lifestyle changes before increasing statin dose.    HSV infection S: Patient reports has been able to take acyclovir 100 mg just once a day without any flareups of genital herpes A/P:  Stable. Continue current medications.  Refilled medication   Other notes: 1.  Colonoscopy at age 73 with Dr. Sherre Poot team is been to reach out to try to get a copy of this  Physical in 6 months recommended  Lab/Order associations: Obesity (BMI 30.0-34.9)  History of melanoma  Hyperlipidemia, unspecified hyperlipidemia type  HSV infection  Meds ordered this encounter  Medications  . acyclovir (ZOVIRAX) 400 MG tablet    Sig: Take 1 tablet (400 mg total) by mouth daily.    Dispense:  90 tablet    Refill:  3    DX Code Needed  B00.9- used for suppression  . atorvastatin (LIPITOR) 40 MG tablet    Sig: Take 1 tablet (40 mg total) by mouth daily.    Dispense:  90 tablet    Refill:  3   Return precautions advised.  Garret Reddish, MD

## 2018-07-29 NOTE — Assessment & Plan Note (Signed)
S: Patient reports has been able to take acyclovir 100 mg just once a day without any flareups of genital herpes A/P:  Stable. Continue current medications.  Refilled medication

## 2018-07-29 NOTE — Assessment & Plan Note (Signed)
Sees dermatology every 3 months after having melanoma discovered early 2020-fully removed-reports stage I

## 2018-07-29 NOTE — Patient Instructions (Addendum)
Health Maintenance Due  Topic Date Due  . COLONOSCOPY - Dr. Collene Mares, done in 2013 per pt report- we will reach out to Dr. Lorie Apley office for copy 09/01/2011   Video visit

## 2018-09-04 DIAGNOSIS — C4372 Malignant melanoma of left lower limb, including hip: Secondary | ICD-10-CM | POA: Diagnosis not present

## 2018-09-04 DIAGNOSIS — L97812 Non-pressure chronic ulcer of other part of right lower leg with fat layer exposed: Secondary | ICD-10-CM | POA: Diagnosis not present

## 2018-09-29 ENCOUNTER — Encounter: Payer: Self-pay | Admitting: Family Medicine

## 2018-10-30 ENCOUNTER — Other Ambulatory Visit: Payer: Self-pay | Admitting: Family Medicine

## 2019-05-10 ENCOUNTER — Other Ambulatory Visit: Payer: Self-pay | Admitting: Family Medicine

## 2019-08-07 ENCOUNTER — Other Ambulatory Visit: Payer: Self-pay | Admitting: Family Medicine

## 2019-11-09 ENCOUNTER — Other Ambulatory Visit: Payer: Self-pay | Admitting: Family Medicine

## 2020-05-19 ENCOUNTER — Other Ambulatory Visit: Payer: Self-pay | Admitting: Family Medicine

## 2020-08-16 ENCOUNTER — Other Ambulatory Visit: Payer: Self-pay | Admitting: Family Medicine

## 2020-08-25 ENCOUNTER — Other Ambulatory Visit: Payer: Self-pay | Admitting: Family Medicine

## 2020-11-08 ENCOUNTER — Other Ambulatory Visit: Payer: Self-pay | Admitting: Family Medicine
